# Patient Record
Sex: Female | Born: 1970 | Race: White | Hispanic: No | Marital: Married | Smoking: Former smoker
Health system: Southern US, Community
[De-identification: ages and names within clinical notes are randomized; demographics above are authoritative.]

---

## 2020-11-12 ENCOUNTER — Ambulatory Visit
Admission: RE | Admit: 2020-11-12 | Discharge: 2020-11-12 | Disposition: A | Discharge status: Home / Self Care | Payer: No Typology Code available for payment source | Source: Ambulatory Visit | Attending: Physical Medicine & Rehabilitation | Admitting: Physical Medicine & Rehabilitation

## 2020-11-12 ENCOUNTER — Other Ambulatory Visit: Payer: Self-pay

## 2020-11-12 ENCOUNTER — Other Ambulatory Visit: Payer: Self-pay | Admitting: Physical Medicine & Rehabilitation

## 2020-11-12 DIAGNOSIS — M5441 Lumbago with sciatica, right side: Secondary | ICD-10-CM | POA: Diagnosis present

## 2020-11-12 DIAGNOSIS — G8929 Other chronic pain: Secondary | ICD-10-CM | POA: Insufficient documentation

## 2020-11-12 DIAGNOSIS — R29898 Other symptoms and signs involving the musculoskeletal system: Secondary | ICD-10-CM | POA: Insufficient documentation

## 2020-11-12 DIAGNOSIS — M542 Cervicalgia: Secondary | ICD-10-CM | POA: Insufficient documentation

## 2020-11-12 IMAGING — MR MR LUMBAR SPINE W/O CM
5 series · 31 of 48 positions shown · non-contrast
Comparison: None.

CLINICAL DATA: Central and right low back pain

EXAM:
MRI LUMBAR SPINE WITHOUT CONTRAST
TECHNIQUE: Multiplanar, multisequence MR imaging of the lumbar spine was
performed. No intravenous contrast was administered.

[Series 14: T2 · sagittal · 4.0mm · 0.81mm/px · 6 of 17 slices shown (1 of 2)]
[im 1/17]
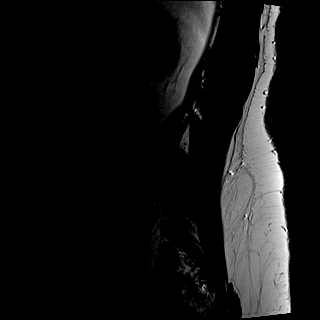
[im 4/17]
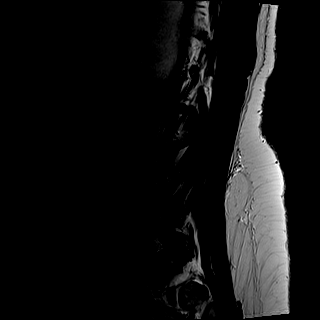
[im 7/17]
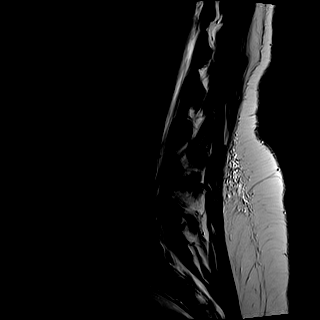
[im 10/17]
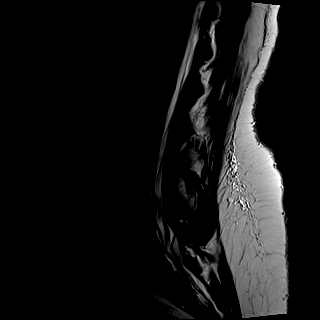
[im 13/17]
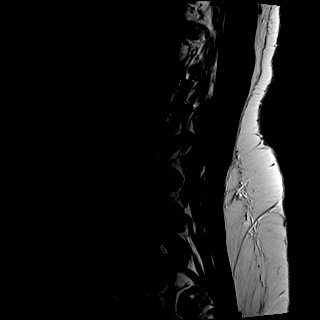
[im 17/17]
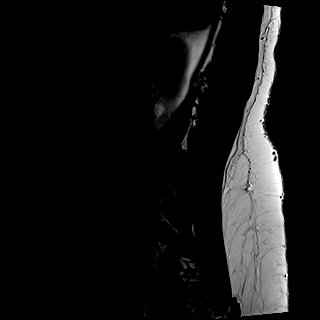

[Series 15: T1 · sagittal · 4.0mm · 0.81mm/px · 7 of 17 slices shown (1 of 2)]
[im 1/17]
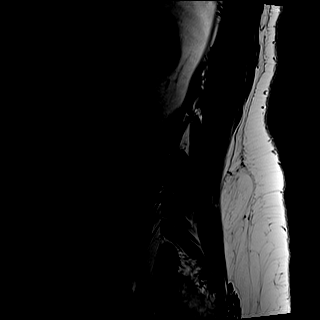
[im 3/17]
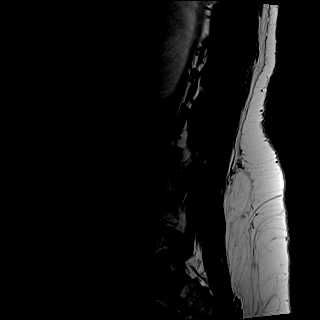
[im 6/17]
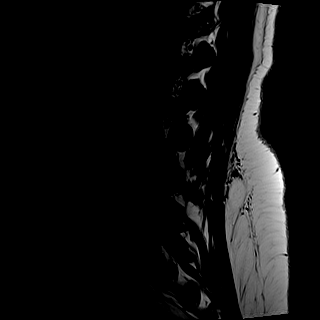
[im 9/17]
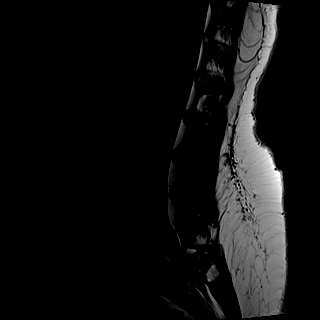
[im 11/17]
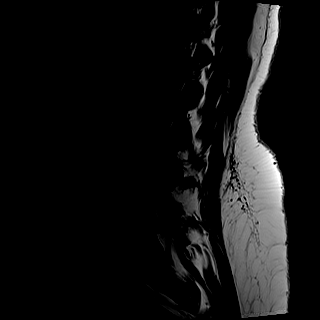
[im 14/17]
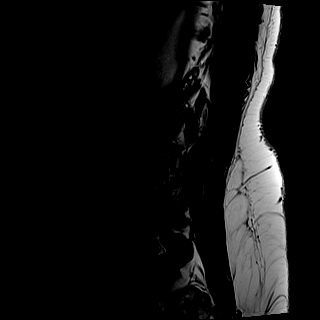
[im 17/17]
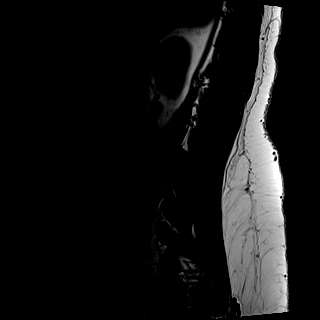

[Series 16: STIR · sagittal · 4.0mm · 0.41mm/px · 2 of 17 slices shown]
[im 1/17]
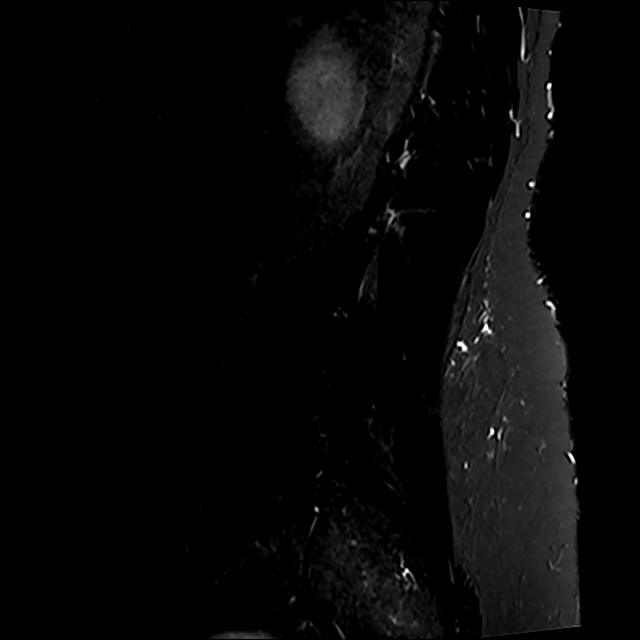
[im 3/17]
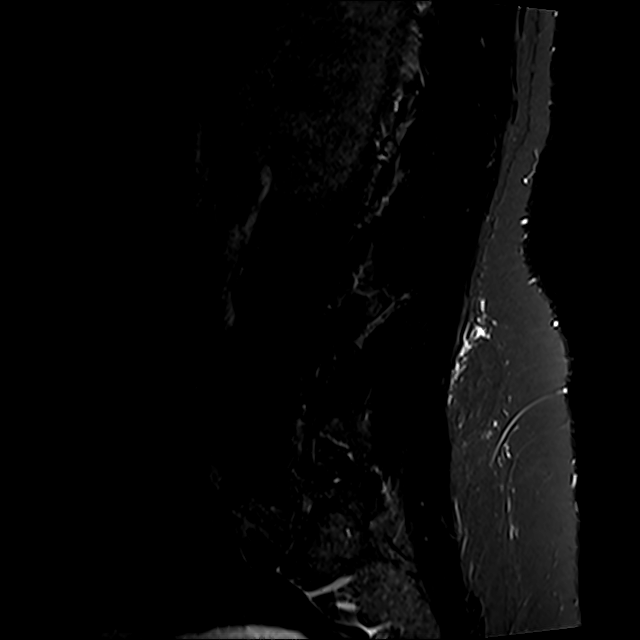

[Series 17: T2 · axial · 4.0mm · 0.78mm/px · z∈[-545,-328]mm · 8 of 36 slices shown (2 of 2)]
[im 1/36]
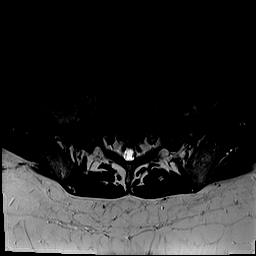
[im 6/36]
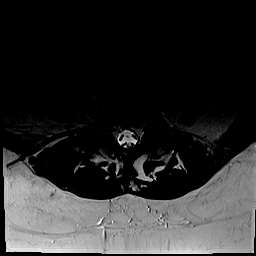
[im 11/36]
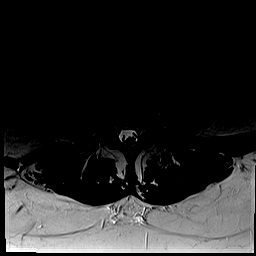
[im 17/36]
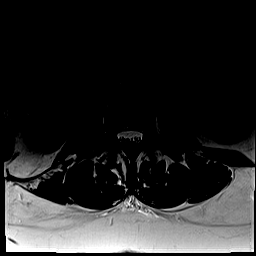
[im 19/36]
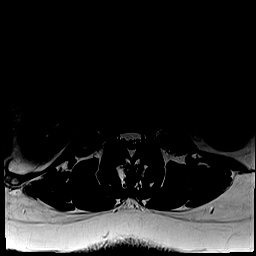
[im 25/36]
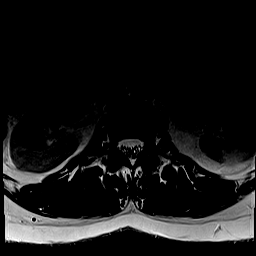
[im 30/36]
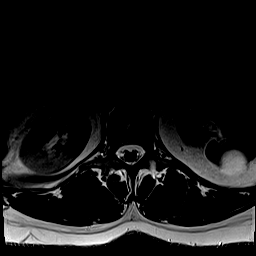
[im 36/36]
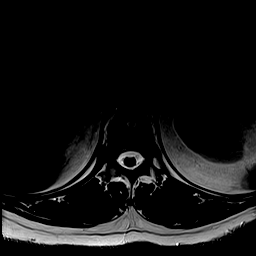

[Series 18: T1 · axial · 4.0mm · 0.39mm/px · z∈[-545,-328]mm · 8 of 36 slices shown (2 of 2)]
[im 1/36]
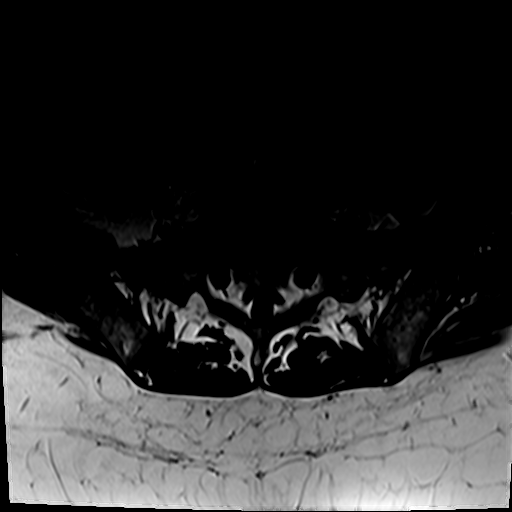
[im 6/36]
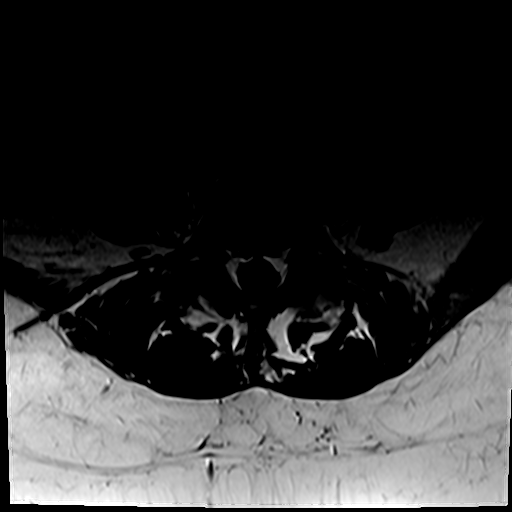
[im 11/36]
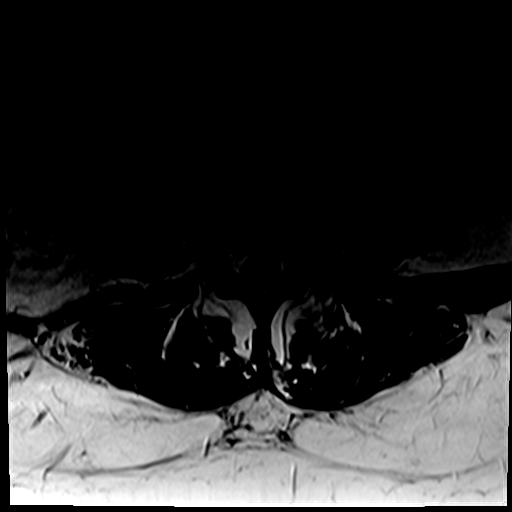
[im 17/36]
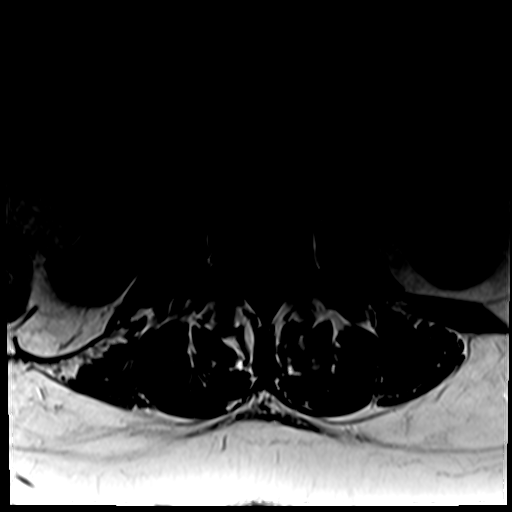
[im 19/36]
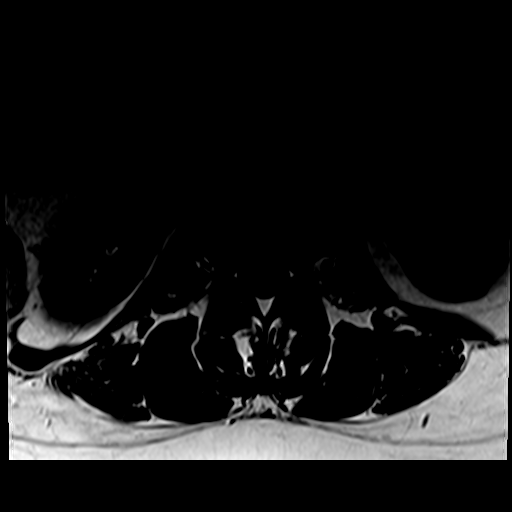
[im 25/36]
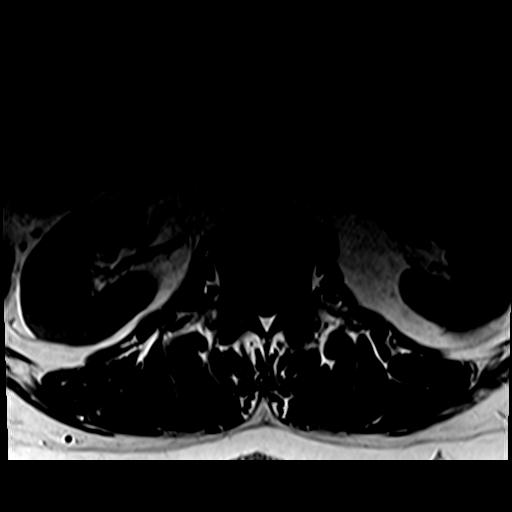
[im 30/36]
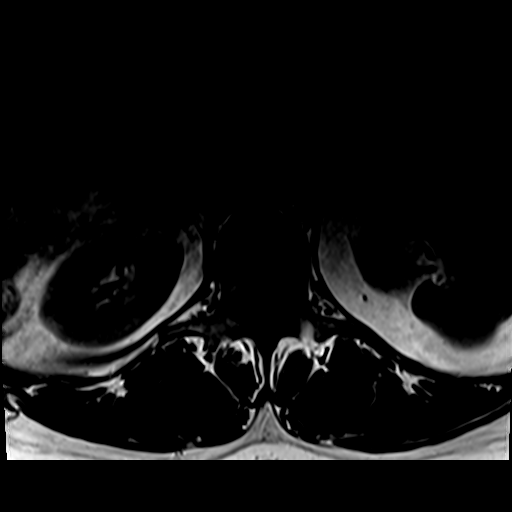
[im 36/36]
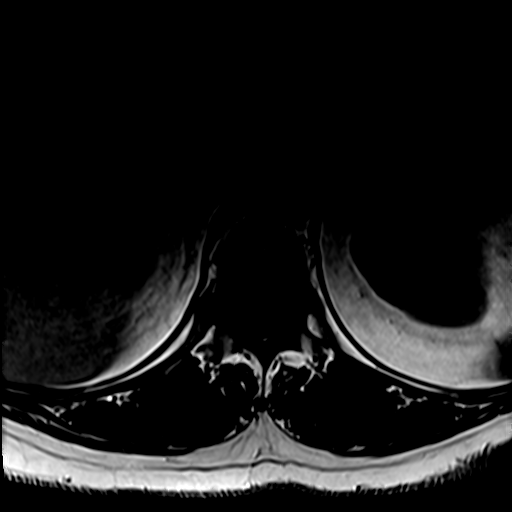

[31 of 48 positions shown; findings below may reference images not displayed]

FINDINGS: Segmentation:  Standard.

Alignment:  Physiologic.

Vertebrae:  No fracture, evidence of discitis, or bone lesion.

Conus medullaris and cauda equina: Conus extends to the L2 level.
Conus and cauda equina appear normal.

Paraspinal and other soft tissues: Incompletely visualized cystic
structure in the upper pelvis (series 17, image 36), possibly an
ovarian cyst.

Disc levels:

L1-L2: Normal disc space and facet joints. No spinal canal stenosis.
No neural foraminal stenosis.

L2-L3: Normal disc space and facet joints. No spinal canal stenosis.
No neural foraminal stenosis.

L3-L4: Normal disc space and facet joints. No spinal canal stenosis.
No neural foraminal stenosis.

L4-L5: Moderate facet hypertrophy. No disc herniation. No spinal
canal stenosis. Mild neural foraminal stenosis.

L5-S1: Disc space narrowing with small bulge. Moderate facet
hypertrophy. No spinal canal stenosis. Mild left neural foraminal
stenosis.

Visualized sacrum: Normal.
IMPRESSION: 1. Mild left neural foraminal stenosis at L4-5 and L5-S1.
2. Moderate facet hypertrophy at L4-5 and L5-S[DATE] be a source of
local low back pain.
3. Incompletely visualized cystic structure of the upper pelvis,
possibly an adnexal cyst. Correlation with pelvic ultrasound is
recommended.

## 2020-11-19 ENCOUNTER — Other Ambulatory Visit: Payer: Self-pay | Admitting: Physical Medicine & Rehabilitation

## 2020-11-19 DIAGNOSIS — N9489 Other specified conditions associated with female genital organs and menstrual cycle: Secondary | ICD-10-CM

## 2020-12-04 ENCOUNTER — Ambulatory Visit
Admission: RE | Admit: 2020-12-04 | Discharge: 2020-12-04 | Disposition: A | Discharge status: Home / Self Care | Payer: No Typology Code available for payment source | Source: Ambulatory Visit | Attending: Physical Medicine & Rehabilitation | Admitting: Physical Medicine & Rehabilitation

## 2020-12-04 ENCOUNTER — Other Ambulatory Visit: Payer: Self-pay

## 2020-12-04 DIAGNOSIS — N9489 Other specified conditions associated with female genital organs and menstrual cycle: Secondary | ICD-10-CM | POA: Insufficient documentation

## 2020-12-04 IMAGING — US US PELVIS COMPLETE WITH TRANSVAGINAL
1 series · 13 of 25 positions shown · non-contrast
Comparison: MRI lumbar spine [DATE]

CLINICAL DATA: Abnormal MRI demonstrating adnexal mass

EXAM:
TRANSABDOMINAL AND TRANSVAGINAL ULTRASOUND OF PELVIS
TECHNIQUE: Both transabdominal and transvaginal ultrasound examinations of the
pelvis were performed. Transabdominal technique was performed for
global imaging of the pelvis including uterus, ovaries, adnexal
regions, and pelvic cul-de-sac. It was necessary to proceed with
endovaginal exam following the transabdominal exam to visualize the
ovaries and adnexa.

[Series 1: us pelvis complete with transvaginal · 0.22mm/px · 106 acquisitions, 13 frames shown]
[im 1/106]
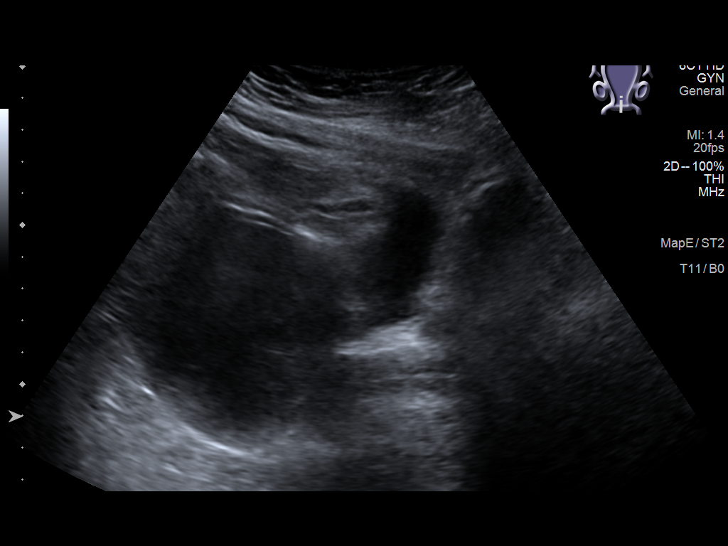
[im 9/106]
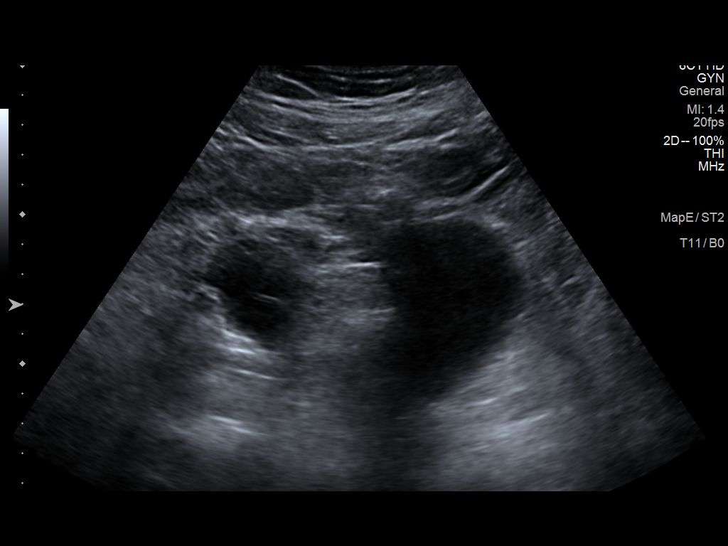
[im 18/106]
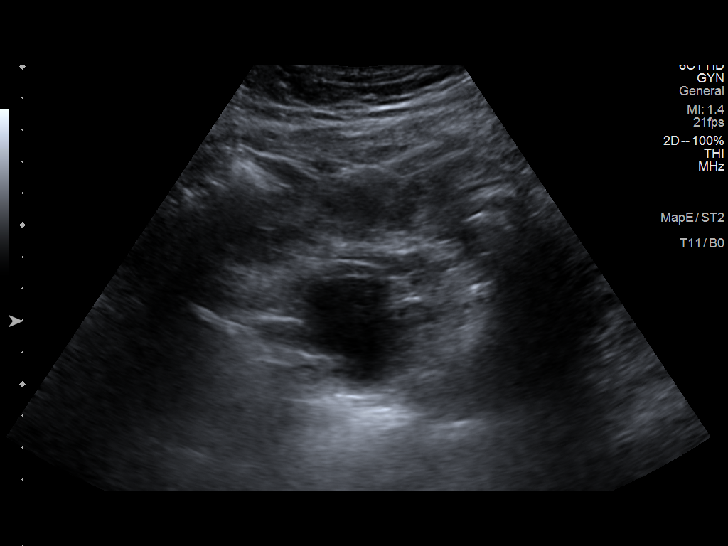
[im 27/106]
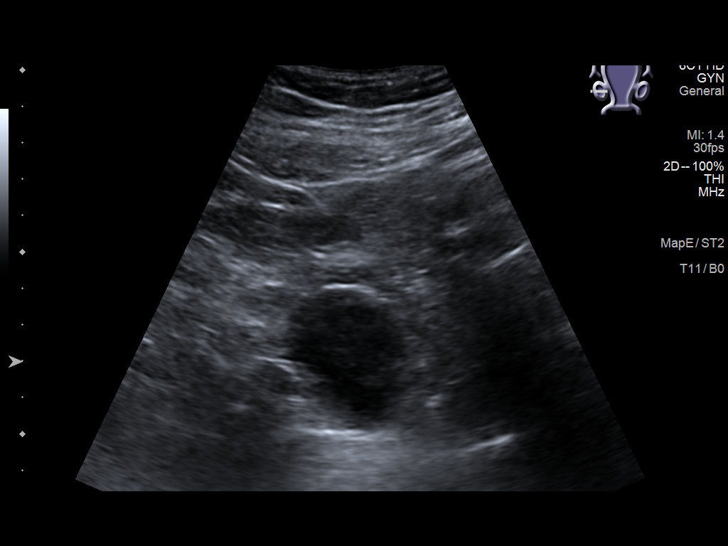
[im 36/106]
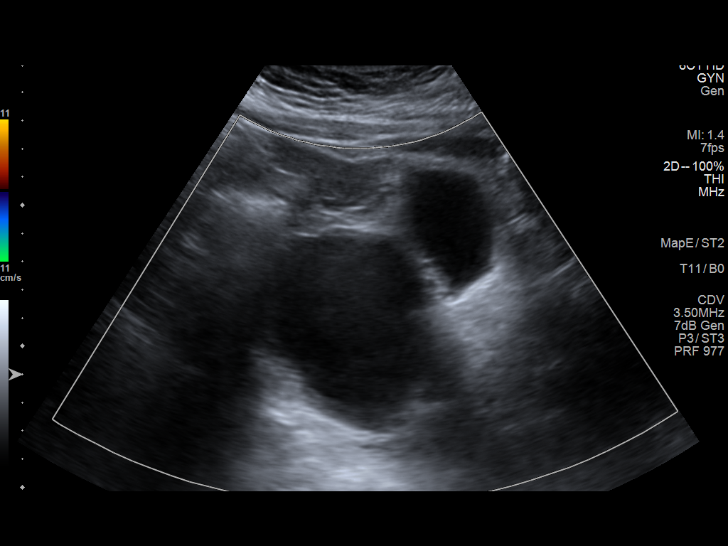
[im 44/106]
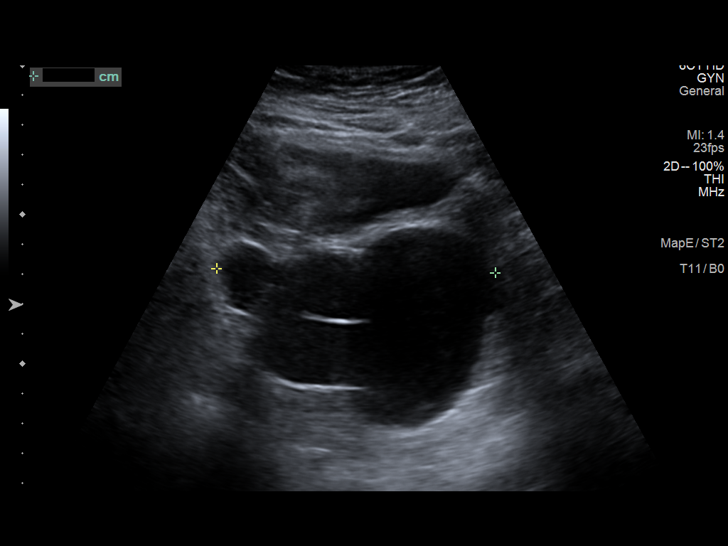
[im 53/106]
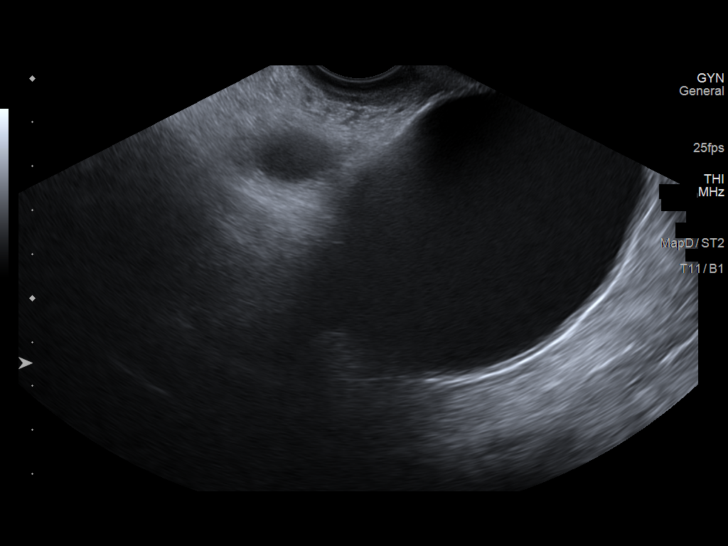
[im 62/106]
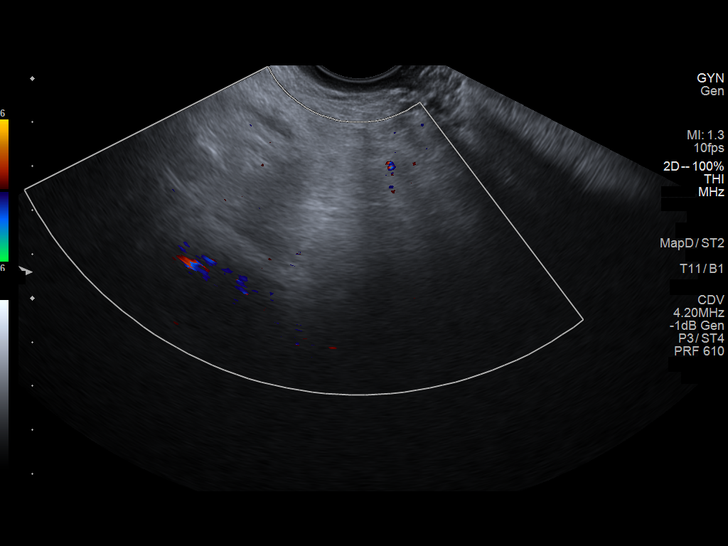
[im 71/106]
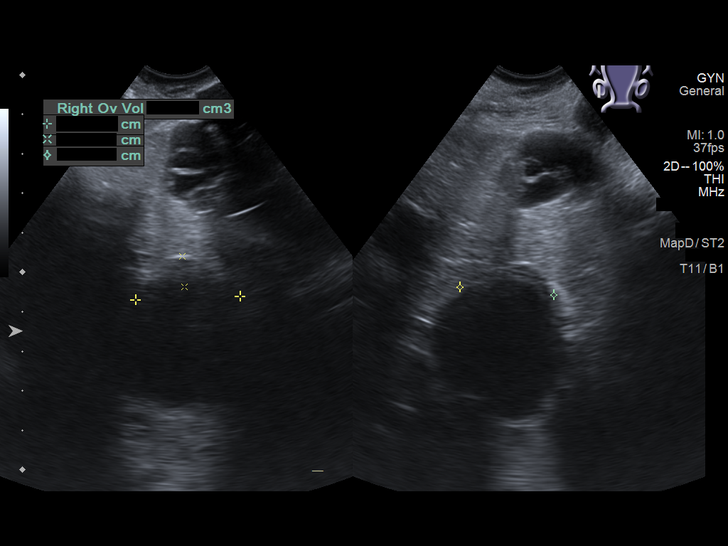
[im 79/106]
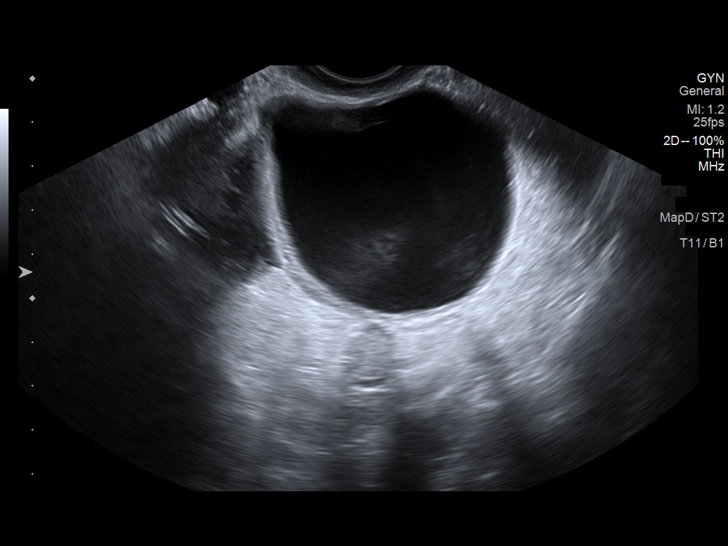
[im 88/106]
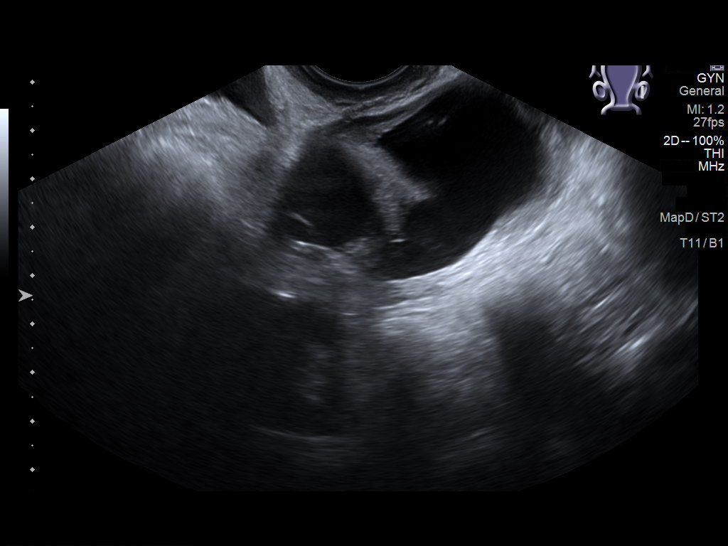
[im 97/106]
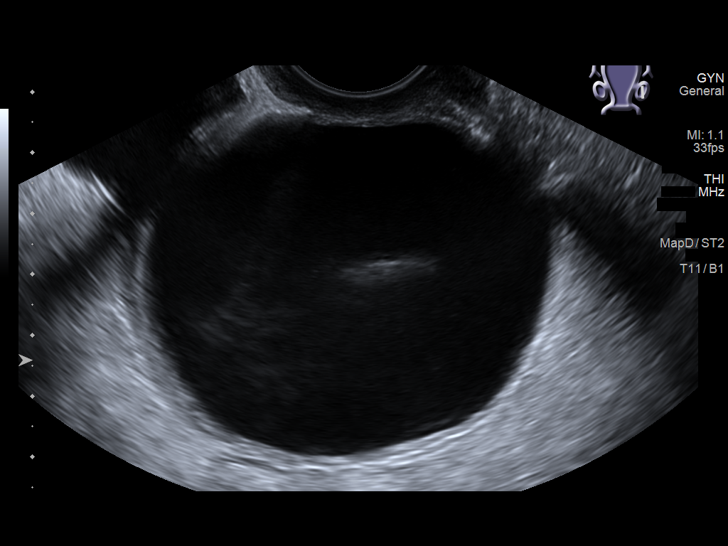
[im 106/106]
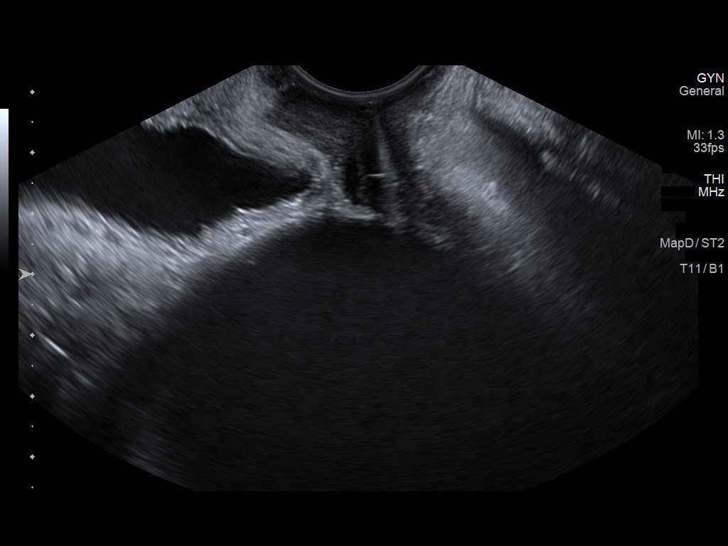

[13 of 25 positions shown; findings below may reference images not displayed]

FINDINGS: Uterus

Surgically absent

Endometrium

Surgically absent

Right ovary

Measurements: 4.6 x 4.0 x 3.5 cm = volume: 34.3 mL. Minimally
complicated cyst within RIGHT ovary 4.0 x 3.5 x 3.5 cm containing
scattered low level internal echogenicity. No mural nodularity or
septations.

Left ovary

Measurements: 3.6 x 2.0 x 2.8 cm = volume: 8.6 mL. Complicated
septated cyst identified arising from the margin of the LEFT ovary
8.1 x 5.5 x 9.0 cm containing multiple septations which vary from
thin to irregular and thick as well as questionable mural nodularity
at the junction of the septations. Blood flow is seen within some of
the septations on color Doppler imaging. Scattered low level
internal echoes.

Other findings

No free pelvic fluid.  No additional pelvic masses.
IMPRESSION: Surgical absence of uterus.

Minimally complicated RIGHT ovarian cyst 4.0 cm diameter.

Large complicated multi septated LEFT ovarian cyst 9.0 cm greatest
size with multiple irregular septations some of which are thick and
questionable mural nodularity.

Lesion is concerning for a cystic ovarian neoplasm and surgical
evaluation is recommended.

These results will be called to the ordering clinician or
representative by the Radiologist Assistant, and communication
documented in the PACS or [[email or web address]].

## 2021-01-05 ENCOUNTER — Other Ambulatory Visit: Payer: Self-pay | Admitting: Neurosurgery

## 2021-01-05 DIAGNOSIS — R29898 Other symptoms and signs involving the musculoskeletal system: Secondary | ICD-10-CM

## 2021-01-15 ENCOUNTER — Other Ambulatory Visit: Payer: Self-pay

## 2021-01-15 ENCOUNTER — Ambulatory Visit
Admission: RE | Admit: 2021-01-15 | Discharge: 2021-01-15 | Disposition: A | Discharge status: Home / Self Care | Payer: No Typology Code available for payment source | Source: Ambulatory Visit | Attending: Neurosurgery | Admitting: Neurosurgery

## 2021-01-15 DIAGNOSIS — R29898 Other symptoms and signs involving the musculoskeletal system: Secondary | ICD-10-CM | POA: Insufficient documentation

## 2021-01-15 IMAGING — MR MR THORACIC SPINE W/O CM
6 series · 29 of 48 positions shown · non-contrast
Comparison: Cervical and lumbar spine MRIs [DATE]

CLINICAL DATA: Mid to lower back pain with right leg pain and
weakness for 4-5 months.

EXAM:
MRI THORACIC SPINE WITHOUT CONTRAST
TECHNIQUE: Multiplanar, multisequence MR imaging of the thoracic spine was
performed. No intravenous contrast was administered.

[Series 16: T1 · sagittal · 5.0mm · 1.41mm/px · 2 of 9 slices shown (1 of 2)]
[im 1/9]
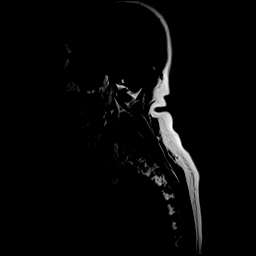
[im 9/9]
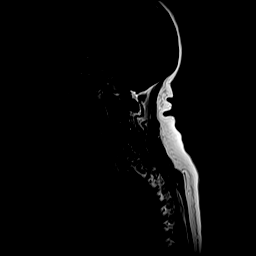

[Series 17: T2 · sagittal · 3.0mm · 1.06mm/px · 6 of 17 slices shown (1 of 2)]
[im 1/17]
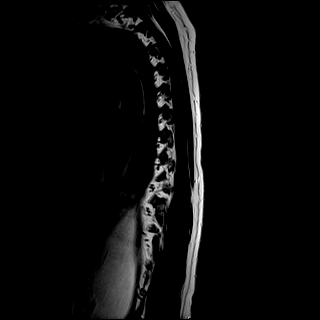
[im 4/17]
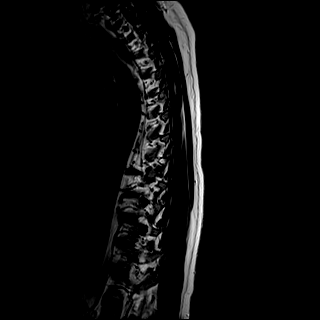
[im 7/17]
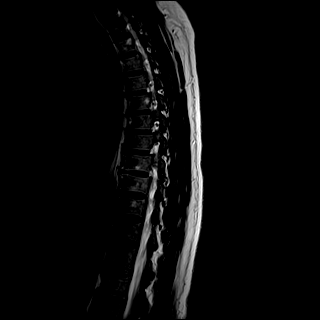
[im 10/17]
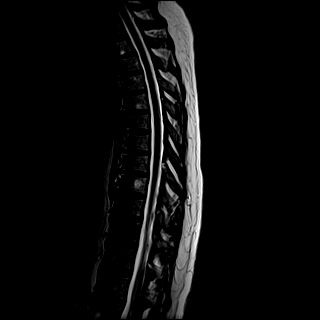
[im 13/17]
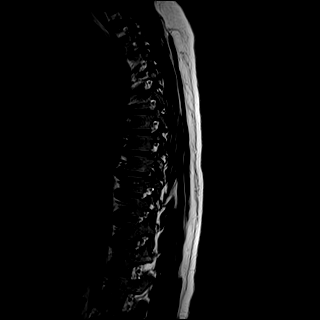
[im 17/17]
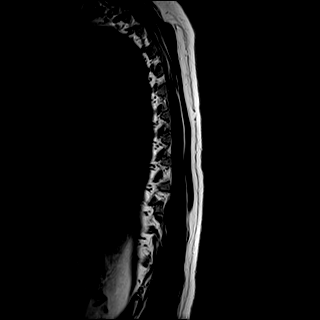

[Series 18: T1 · sagittal · 3.0mm · 1.06mm/px · 6 of 17 slices shown (2 of 2)]
[im 1/17]
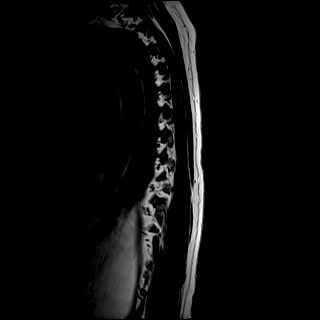
[im 4/17]
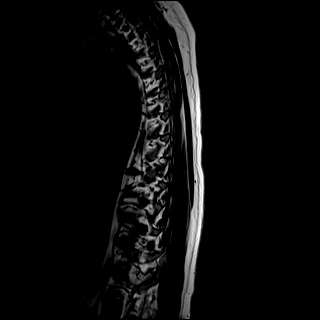
[im 7/17]
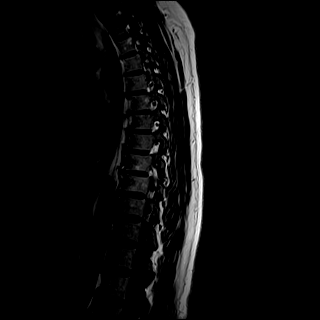
[im 10/17]
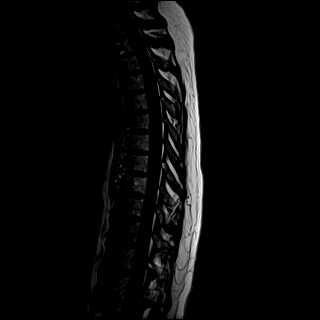
[im 13/17]
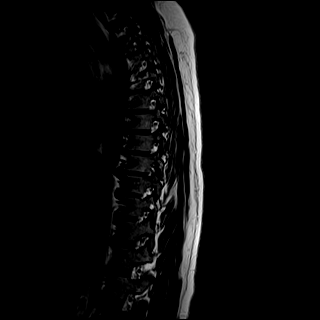
[im 17/17]
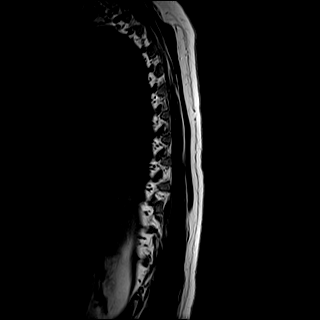

[Series 19: STIR · sagittal · 3.0mm · 0.53mm/px · 6 of 17 slices shown]
[im 1/17]
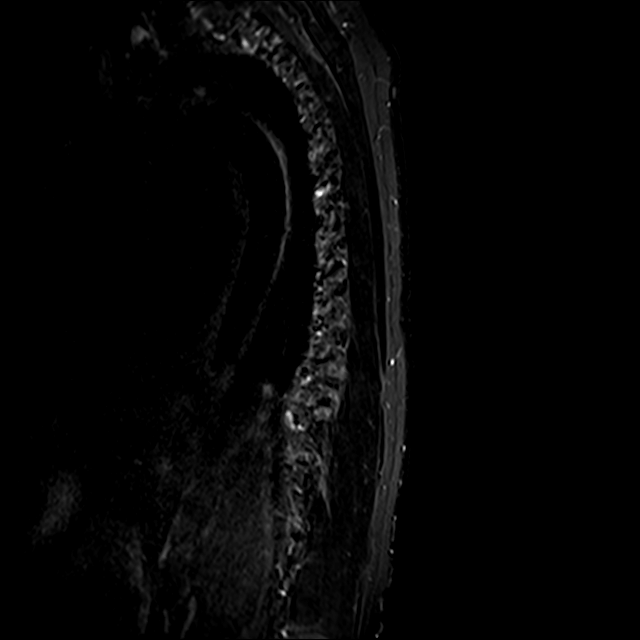
[im 4/17]
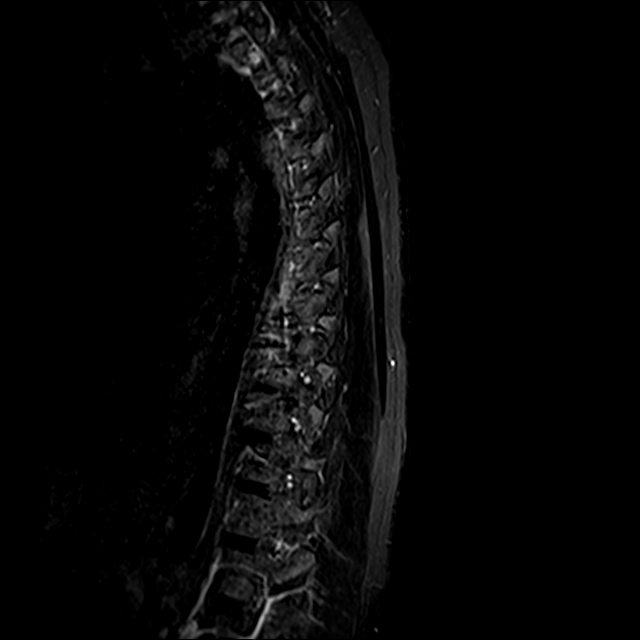
[im 7/17]
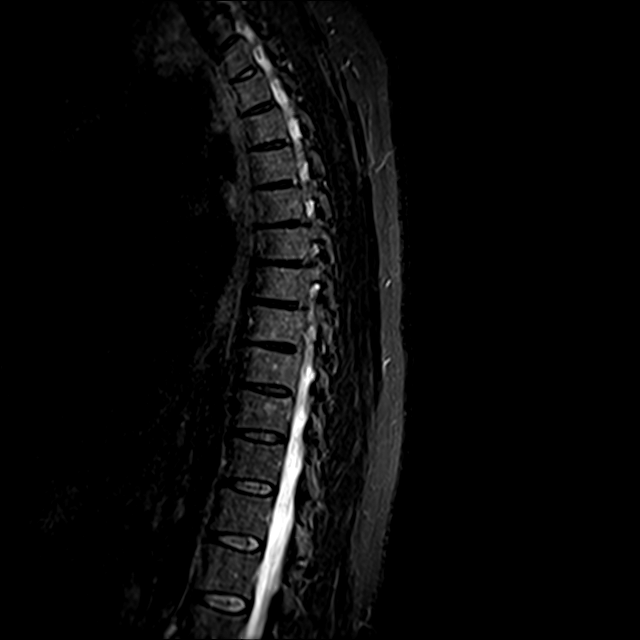
[im 10/17]
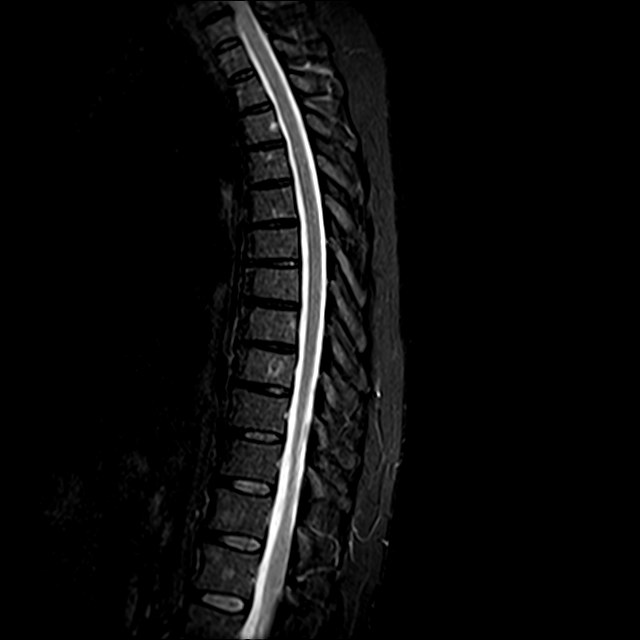
[im 13/17]
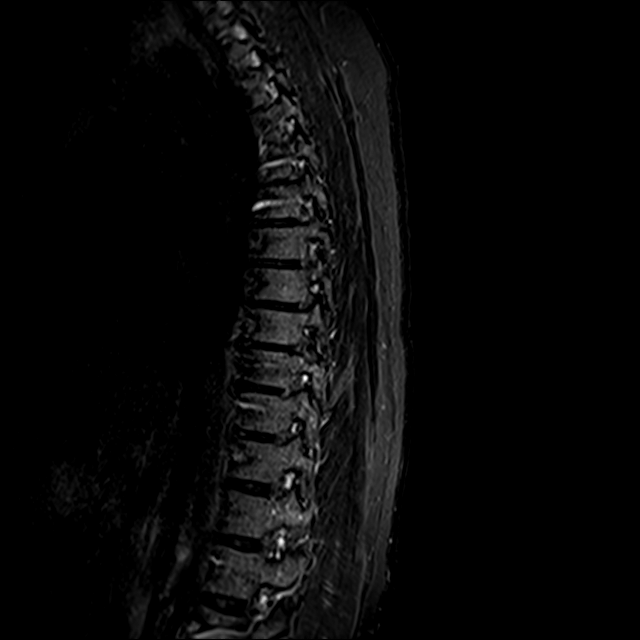
[im 17/17]
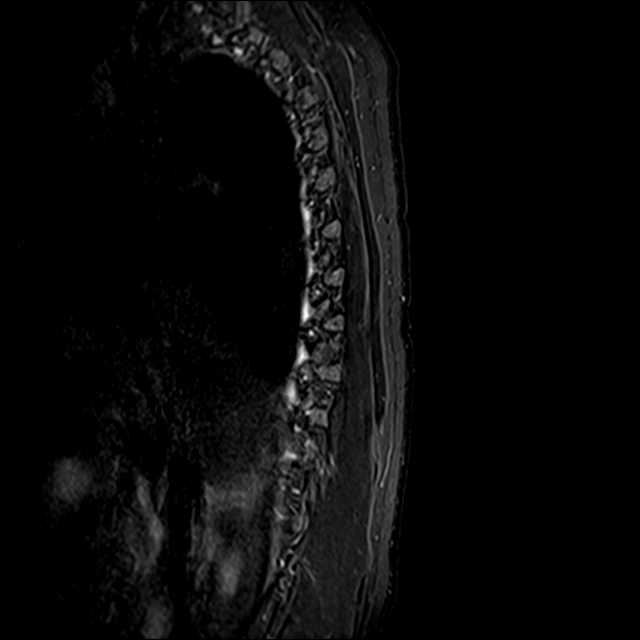

[Series 20: T2 · axial · 4.0mm · 0.59mm/px · z∈[-309,-91]mm · 8 of 39 slices shown (2 of 2)]
[im 1/39]
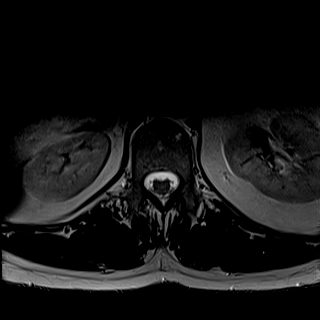
[im 6/39]
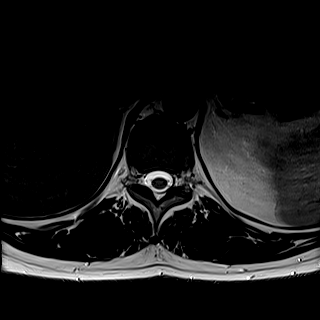
[im 12/39]
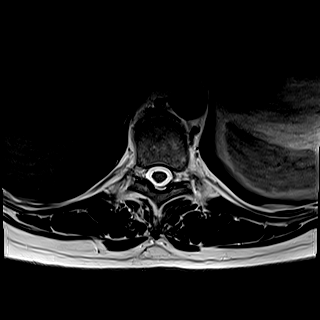
[im 18/39]
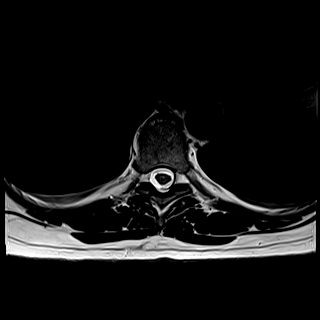
[im 21/39]
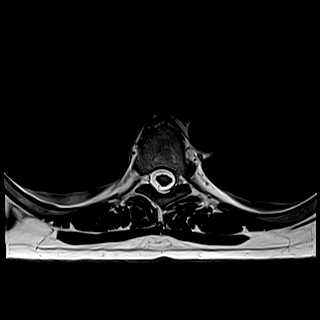
[im 27/39]
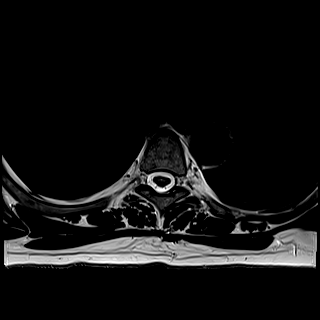
[im 33/39]
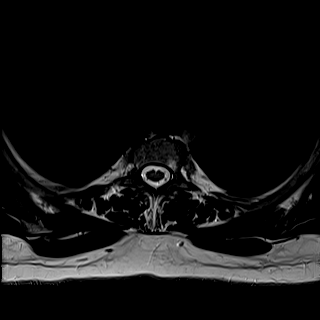
[im 39/39]
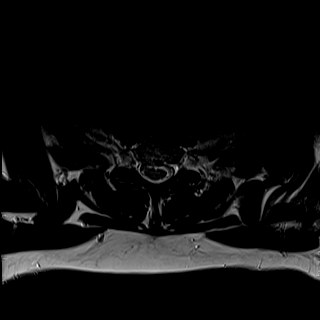

[Series 21: GRE · axial · 4.0mm · 0.37mm/px · 1 of 39 slices shown]
[im 1/39]
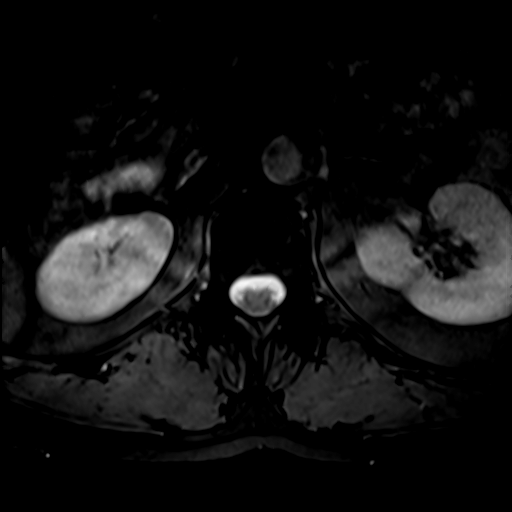

[29 of 48 positions shown; findings below may reference images not displayed]

FINDINGS: Alignment:  Normal.

Vertebrae: No fracture, suspicious marrow lesion, or significant
marrow edema. Numerous small vertebral hemangiomas.

Cord:  Normal signal.

Paraspinal and other soft tissues: Trace bilateral pleural
effusions.

Disc levels:

Limited assessment of the cervical spine on sagittal images
demonstrates chronic left eccentric disc bulging at C6-7 with
associated mild cord flattening, more fully evaluated on the prior
cervical spine MRI. A tiny left paracentral disc protrusion at T6-7
does not result in spinal cord mass effect or stenosis. There is
focally advanced bilateral facet arthrosis at T8-9 resulting in mild
bilateral neural foraminal stenosis.
IMPRESSION: 1. Advanced facet arthrosis at T8-9 with mild bilateral neural
foraminal stenosis.
2. Tiny left paracentral disc protrusion at T6-7 without stenosis.

## 2021-01-29 ENCOUNTER — Other Ambulatory Visit: Payer: Self-pay | Admitting: Neurology

## 2021-01-29 DIAGNOSIS — G8929 Other chronic pain: Secondary | ICD-10-CM

## 2021-01-29 DIAGNOSIS — R519 Headache, unspecified: Secondary | ICD-10-CM

## 2021-02-04 ENCOUNTER — Ambulatory Visit: Payer: No Typology Code available for payment source

## 2021-02-12 ENCOUNTER — Ambulatory Visit: Payer: No Typology Code available for payment source

## 2021-02-27 ENCOUNTER — Other Ambulatory Visit: Payer: Self-pay | Admitting: Neurology

## 2021-02-27 DIAGNOSIS — M79609 Pain in unspecified limb: Secondary | ICD-10-CM

## 2021-03-18 ENCOUNTER — Ambulatory Visit: Payer: No Typology Code available for payment source

## 2021-04-22 ENCOUNTER — Other Ambulatory Visit: Payer: Self-pay

## 2021-04-22 ENCOUNTER — Encounter: Payer: Self-pay | Admitting: Student in an Organized Health Care Education/Training Program

## 2021-04-22 ENCOUNTER — Ambulatory Visit
Payer: No Typology Code available for payment source | Attending: Student in an Organized Health Care Education/Training Program | Admitting: Student in an Organized Health Care Education/Training Program

## 2021-04-22 VITALS — BP 146/105 | HR 104 | Temp 97.1°F | Resp 18 | Ht 65.0 in | Wt 225.0 lb

## 2021-04-22 DIAGNOSIS — M797 Fibromyalgia: Secondary | ICD-10-CM | POA: Diagnosis present

## 2021-04-22 DIAGNOSIS — M47816 Spondylosis without myelopathy or radiculopathy, lumbar region: Secondary | ICD-10-CM | POA: Diagnosis present
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
## 2021-04-22 NOTE — Progress Notes (Signed)
Safety precautions to be maintained throughout the outpatient stay will include: orient to surroundings, keep bed in low position, maintain call bell within reach at all times, provide assistance with transfer out of bed and ambulation.  

## 2022-05-12 ENCOUNTER — Other Ambulatory Visit: Payer: Self-pay | Admitting: Neurology

## 2022-05-12 DIAGNOSIS — R569 Unspecified convulsions: Secondary | ICD-10-CM

## 2022-05-24 ENCOUNTER — Ambulatory Visit
Admission: RE | Admit: 2022-05-24 | Discharge: 2022-05-24 | Disposition: A | Discharge status: Home / Self Care | Payer: No Typology Code available for payment source | Source: Ambulatory Visit | Attending: Neurology | Admitting: Neurology

## 2022-05-24 DIAGNOSIS — R569 Unspecified convulsions: Secondary | ICD-10-CM | POA: Diagnosis not present

## 2022-05-24 MED ORDER — GADOBUTROL 1 MMOL/ML IV SOLN
10.0000 mL | Freq: Once | INTRAVENOUS | Status: AC | PRN
  Administered 2022-05-24: 10 mL via INTRAVENOUS

## 2022-07-20 ENCOUNTER — Other Ambulatory Visit: Payer: Self-pay | Admitting: Physical Medicine & Rehabilitation

## 2022-07-20 DIAGNOSIS — G8929 Other chronic pain: Secondary | ICD-10-CM

## 2022-08-02 ENCOUNTER — Ambulatory Visit
Admission: RE | Admit: 2022-08-02 | Discharge: 2022-08-02 | Disposition: A | Discharge status: Home / Self Care | Payer: No Typology Code available for payment source | Source: Ambulatory Visit | Attending: Physical Medicine & Rehabilitation | Admitting: Physical Medicine & Rehabilitation

## 2022-08-02 DIAGNOSIS — G8929 Other chronic pain: Secondary | ICD-10-CM

## 2022-10-27 ENCOUNTER — Other Ambulatory Visit: Payer: Self-pay | Admitting: Physical Medicine & Rehabilitation

## 2022-10-27 DIAGNOSIS — M5412 Radiculopathy, cervical region: Secondary | ICD-10-CM

## 2022-11-04 NOTE — Discharge Instructions (Signed)

## 2022-11-05 ENCOUNTER — Ambulatory Visit
Admission: RE | Admit: 2022-11-05 | Discharge: 2022-11-05 | Disposition: A | Discharge status: Home / Self Care | Payer: No Typology Code available for payment source | Source: Ambulatory Visit | Attending: Physical Medicine & Rehabilitation | Admitting: Physical Medicine & Rehabilitation

## 2022-11-05 DIAGNOSIS — M5412 Radiculopathy, cervical region: Secondary | ICD-10-CM

## 2022-11-05 MED ORDER — TRIAMCINOLONE ACETONIDE 40 MG/ML IJ SUSP (RADIOLOGY)
60.0000 mg | Freq: Once | INTRAMUSCULAR | Status: AC
  Administered 2022-11-05: 60 mg via EPIDURAL

## 2022-11-05 MED ORDER — IOPAMIDOL (ISOVUE-M 300) INJECTION 61%
1.0000 mL | Freq: Once | INTRAMUSCULAR | Status: AC | PRN
  Administered 2022-11-05: 1 mL via EPIDURAL
